# Patient Record
Sex: Male | Born: 1990 | Race: Black or African American | Hispanic: No | Marital: Single | Smoking: Never smoker
Health system: Southern US, Community
[De-identification: ages and names within clinical notes are randomized; demographics above are authoritative.]

## PROBLEM LIST (undated history)

## (undated) DIAGNOSIS — M6282 Rhabdomyolysis: Secondary | ICD-10-CM

---
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
## 2013-05-06 ENCOUNTER — Encounter (HOSPITAL_BASED_OUTPATIENT_CLINIC_OR_DEPARTMENT_OTHER): Payer: Self-pay | Admitting: *Deleted

## 2013-05-06 ENCOUNTER — Emergency Department (HOSPITAL_BASED_OUTPATIENT_CLINIC_OR_DEPARTMENT_OTHER)
Admission: EM | Admit: 2013-05-06 | Discharge: 2013-05-07 | Disposition: A | Discharge status: Home / Self Care | Payer: Medicaid Other | Attending: Emergency Medicine | Admitting: Emergency Medicine

## 2013-05-06 DIAGNOSIS — L738 Other specified follicular disorders: Secondary | ICD-10-CM | POA: Insufficient documentation

## 2013-05-06 DIAGNOSIS — L739 Follicular disorder, unspecified: Secondary | ICD-10-CM

## 2013-05-06 DIAGNOSIS — L678 Other hair color and hair shaft abnormalities: Secondary | ICD-10-CM | POA: Insufficient documentation

## 2013-05-06 NOTE — ED Notes (Signed)
Pt reports red bumps that started on his face this morning and now he has them on his arms.

## 2013-05-07 NOTE — ED Provider Notes (Signed)
CSN: [id number]     Arrival date & time 05/06/13  2345 History  This chart was scribed for Terry Krisher [name], MD by Ronal Fear, ED Scribe. This patient was seen in room MH11/MH11 and the patient's care was started at 12:01 AM.   Chief Complaint  Patient presents with  . Rash    Patient is a 22 y.o. Barry presenting with rash. The history is provided by the patient. No language interpreter was used.  Rash Location:  Full body Quality: itchiness   Severity:  Mild Onset quality:  Sudden Duration:  12 hours Timing:  Constant Progression:  Spreading Chronicity:  New Context comment:  Community shower Relieved by:  None tried Worsened by:  Nothing tried Ineffective treatments:  None tried Associated symptoms: no fever, no headaches, no joint pain, no periorbital edema and not wheezing     HPI Comments: Terry Barry who presents to the Emergency Department complaining of red, itchy bumps all over his body that appeared when he woke up this morning. They are on his face, arms and legs bilaterally. Pt states his tongue is itchy. He denies any other associated symptoms. Pt is in college and uses community showers.    History reviewed. No pertinent past medical history. History reviewed. No pertinent past surgical history. No family history on file. History  Substance Use Topics  . Smoking status: Never Smoker   . Smokeless tobacco: Not on file  . Alcohol Use: No    Review of Systems  Constitutional: Negative for fever.  Respiratory: Negative for wheezing.   Musculoskeletal: Negative for arthralgias.  Skin: Positive for rash.  Neurological: Negative for headaches.  All other systems reviewed and are negative.    Allergies  Review of patient's allergies indicates not on file.  Home Medications  No current outpatient prescriptions on file. BP 119/68  Pulse 61  Temp(Src) 98.5 F (36.9 C) (Oral)  Ht 5\' 7"  (1.702 m)  Wt 170 lb (77.111 kg)  BMI 26.62  kg/m2  SpO2 100% Physical Exam  Nursing note and vitals reviewed. Constitutional: He is oriented to person, place, and time. He appears well-developed and well-nourished. No distress.  HENT:  Head: Normocephalic and atraumatic.  No lesions in mouth.   Eyes: EOM are normal.  Neck: Neck supple. No tracheal deviation present.  Cardiovascular: Normal rate and normal heart sounds.   Pulmonary/Chest: Effort normal. No respiratory distress.  Abdominal: Soft. There is no tenderness.  Musculoskeletal: Normal range of motion.  Neurological: He is alert and oriented to person, place, and time.  Skin: Skin is warm and dry.  Papules to face. 4 papules to neck and 2 on bilateral arms with small white heads. Consistent with folliculitis. 1 insect bite to left clavicle. Nothing between fingers.   Psychiatric: He has a normal mood and affect. His behavior is normal.    ED Course  Procedures (including critical care time) DIAGNOSTIC STUDIES: Oxygen Saturation is 100% on RA, normal by my interpretation.    COORDINATION OF CARE: 12:05 AM- Pt advised of plan for treatment including using stridex pads for the face bumps and benadryl for itching and pt agrees. Advised him of keeping his cleaning products clean and dry to prevent future exposure and pt agrees.    Labs Review Labs Reviewed - No data to display Imaging Review No results found.  MDM      I personally performed the services described in this documentation, which was scribed in my presence.  The recorded information has been reviewed and is accurate.     Terry Awe, MD 05/07/13 [phone]

## 2013-07-17 ENCOUNTER — Encounter (HOSPITAL_BASED_OUTPATIENT_CLINIC_OR_DEPARTMENT_OTHER): Payer: Self-pay | Admitting: Emergency Medicine

## 2013-07-17 ENCOUNTER — Emergency Department (HOSPITAL_BASED_OUTPATIENT_CLINIC_OR_DEPARTMENT_OTHER): Payer: Medicaid Other

## 2013-07-17 ENCOUNTER — Emergency Department (HOSPITAL_BASED_OUTPATIENT_CLINIC_OR_DEPARTMENT_OTHER)
Admission: EM | Admit: 2013-07-17 | Discharge: 2013-07-17 | Disposition: A | Discharge status: Home / Self Care | Payer: Medicaid Other | Attending: Emergency Medicine | Admitting: Emergency Medicine

## 2013-07-17 DIAGNOSIS — R0789 Other chest pain: Secondary | ICD-10-CM | POA: Insufficient documentation

## 2013-07-17 DIAGNOSIS — Z8739 Personal history of other diseases of the musculoskeletal system and connective tissue: Secondary | ICD-10-CM | POA: Insufficient documentation

## 2013-07-17 DIAGNOSIS — R071 Chest pain on breathing: Secondary | ICD-10-CM

## 2013-07-17 DIAGNOSIS — R05 Cough: Secondary | ICD-10-CM | POA: Insufficient documentation

## 2013-07-17 DIAGNOSIS — R059 Cough, unspecified: Secondary | ICD-10-CM | POA: Insufficient documentation

## 2013-07-17 DIAGNOSIS — R0602 Shortness of breath: Secondary | ICD-10-CM | POA: Insufficient documentation

## 2013-07-17 HISTORY — DX: Rhabdomyolysis: M62.82

## 2013-07-17 LAB — CK: Total CK: 584 U/L — ABNORMAL HIGH (ref 7–232)

## 2013-07-17 LAB — COMPREHENSIVE METABOLIC PANEL
ALT: 12 U/L (ref 0–53)
AST: 28 U/L (ref 0–37)
Albumin: 4.3 g/dL (ref 3.5–5.2)
Alkaline Phosphatase: 81 U/L (ref 39–117)
BUN: 9 mg/dL (ref 6–23)
CO2: 28 mEq/L (ref 19–32)
Calcium: 9.3 mg/dL (ref 8.4–10.5)
Chloride: 102 mEq/L (ref 96–112)
Creatinine, Ser: 1 mg/dL (ref 0.50–1.35)
GFR calc Af Amer: >90 mL/min (ref >90)
GFR calc non Af Amer: >90 mL/min (ref >90)
Glucose, Bld: 99 mg/dL (ref 70–99)
Potassium: 3.5 mEq/L (ref 3.5–5.1)
Sodium: 140 mEq/L (ref 135–145)
Total Bilirubin: 0.3 mg/dL (ref 0.3–1.2)
Total Protein: 8.1 g/dL (ref 6.0–8.3)

## 2013-07-17 LAB — CBC WITH DIFFERENTIAL/PLATELET
Basophils Absolute: 0 10*3/uL (ref 0.0–0.1)
Basophils Relative: 0 % (ref 0–1)
Eosinophils Absolute: 0.3 10*3/uL (ref 0.0–0.7)
Eosinophils Relative: 4 % (ref 0–5)
HCT: 38.1 % — ABNORMAL LOW (ref 39.0–52.0)
Hemoglobin: 12.8 g/dL — ABNORMAL LOW (ref 13.0–17.0)
Lymphocytes Relative: 30 % (ref 12–46)
Lymphs Abs: 2.6 10*3/uL (ref 0.7–4.0)
MCH: 29.2 pg (ref 26.0–34.0)
MCHC: 33.6 g/dL (ref 30.0–36.0)
MCV: 86.8 fL (ref 78.0–100.0)
Monocytes Absolute: 0.7 10*3/uL (ref 0.1–1.0)
Monocytes Relative: 8 % (ref 3–12)
Neutro Abs: 5.1 10*3/uL (ref 1.7–7.7)
Neutrophils Relative %: 58 % (ref 43–77)
Platelets: 243 10*3/uL (ref 150–400)
RBC: 4.39 MIL/uL (ref 4.22–5.81)
RDW: 11.8 % (ref 11.5–15.5)
WBC: 8.8 10*3/uL (ref 4.0–10.5)

## 2013-07-17 LAB — D-DIMER, QUANTITATIVE (NOT AT ARMC): D-Dimer, Quant: <0.27 ug/mL-FEU (ref 0.00–0.48)

## 2013-07-17 LAB — URINALYSIS, ROUTINE W REFLEX MICROSCOPIC
Bilirubin Urine: NEGATIVE
Glucose, UA: NEGATIVE mg/dL
Hgb urine dipstick: NEGATIVE
Ketones, ur: NEGATIVE mg/dL
Leukocytes, UA: NEGATIVE
Nitrite: NEGATIVE
Protein, ur: NEGATIVE mg/dL
Specific Gravity, Urine: 1.019 (ref 1.005–1.030)
Urobilinogen, UA: 0.2 mg/dL (ref 0.0–1.0)
pH: 6.5 (ref 5.0–8.0)

## 2013-07-17 LAB — TROPONIN I: Troponin I: <0.3 ng/mL (ref <0.30)

## 2013-07-17 MED ORDER — IBUPROFEN 800 MG PO TABS: 800.0000 mg | ORAL_TABLET | Freq: Three times a day (TID) | ORAL | Status: DC | PRN

## 2013-07-17 MED ORDER — HYDROCODONE-ACETAMINOPHEN 5-325 MG PO TABS
1.0000 | ORAL_TABLET | Freq: Once | ORAL | Status: AC
  Administered 2013-07-17: 1 via ORAL
  Filled 2013-07-17: qty 1

## 2013-07-17 MED ORDER — HYDROCODONE-ACETAMINOPHEN 5-325 MG PO TABS: 1.0000 | ORAL_TABLET | Freq: Four times a day (QID) | ORAL | Status: AC | PRN

## 2013-07-17 NOTE — ED Provider Notes (Signed)
CSN: [id number]     Arrival date & time 07/17/13  4098 History   None    Chief Complaint  Patient presents with  . Shortness of Breath  . Chest Pain   (Consider location/radiation/quality/duration/timing/severity/associated sxs/prior Treatment) Patient is a 22 y.o. male presenting with chest pain. The history is provided by the patient.  Chest Pain Pain location:  L chest Pain quality: stabbing, throbbing and tightness   Pain quality: not aching, not burning, not crushing, not shooting and not tearing   Pain radiates to:  L shoulder Pain radiates to the back: no   Pain severity:  Severe Onset quality:  Sudden Duration:  12 hours Timing:  Constant Progression:  Unchanged Chronicity:  New Context: breathing, movement, raising an arm and stress   Context: no drug use, not eating, no intercourse, not lifting, not at rest and no trauma   Relieved by:  Nothing Worsened by:  Coughing, exertion, movement and deep breathing Ineffective treatments:  None tried Associated symptoms: no abdominal pain, no altered mental status, no anorexia, no anxiety, no back pain, no cough, no dizziness, no dysphagia, no fatigue, no fever, no headache, no heartburn, no lower extremity edema, no nausea, no near-syncope, no numbness, no orthopnea, no palpitations, no PND, no shortness of breath, no syncope, not vomiting and no weakness   Risk factors: male sex   Risk factors: no aortic disease, no birth control, no coronary artery disease, no diabetes mellitus, no hypertension, no immobilization, no Marfan's syndrome, not obese, not pregnant, no prior DVT/PE, no smoking and no surgery    Patient reports a lot of stress at college. He is trying to finish a lot of work for the end of the semester.  Of note, PMHX significant for rhabdomyolysis with a negative genetic w/u. Patient reports no recent physical activity.  Past Medical History  Diagnosis Date  . Rhabdomyolysis    History reviewed. No pertinent past  surgical history. History reviewed. No pertinent family history. History  Substance Use Topics  . Smoking status: Never Smoker   . Smokeless tobacco: Not on file  . Alcohol Use: No    Review of Systems  Constitutional: Negative for fever and fatigue.  HENT: Negative for trouble swallowing.   Respiratory: Negative for cough and shortness of breath.   Cardiovascular: Positive for chest pain. Negative for palpitations, orthopnea, syncope, PND and near-syncope.  Gastrointestinal: Negative for heartburn, nausea, vomiting, abdominal pain and anorexia.  Musculoskeletal: Negative for back pain.  Neurological: Negative for dizziness, weakness, numbness and headaches.    Allergies  Review of patient's allergies indicates no known allergies.  Home Medications  No current outpatient prescriptions on file. There were no vitals taken for this visit. Physical Exam  Constitutional: He is oriented to person, place, and time. He appears well-developed and well-nourished.  HENT:  Head: Normocephalic.  Mouth/Throat: Oropharynx is clear and moist. No oropharyngeal exudate.  Eyes: Conjunctivae and EOM are normal. Pupils are equal, round, and reactive to light.  Neck: Normal range of motion. Neck supple.  Cardiovascular: Normal rate, normal heart sounds and intact distal pulses.  Exam reveals no gallop and no friction rub.   No murmur heard. Pulmonary/Chest: Effort normal and breath sounds normal. No respiratory distress. He has no wheezes. He has no rales. He exhibits tenderness.    Tender to palaption  Abdominal: Soft. Bowel sounds are normal. He exhibits no distension and no mass. There is no tenderness. There is no rebound and no guarding.  Musculoskeletal: He  exhibits tenderness. He exhibits no edema.  Neurological: He is alert and oriented to person, place, and time. No cranial nerve deficit.  Skin: Skin is warm and dry. He is not diaphoretic.  Psychiatric: He has a normal mood and affect.  His behavior is normal.    ED Course  Procedures (including critical care time) Labs Review Labs Reviewed  CK - Abnormal; Notable for the following:    Total CK 584 (*)    All other components within normal limits  CBC WITH DIFFERENTIAL - Abnormal; Notable for the following:    Hemoglobin 12.8 (*)    HCT 38.1 (*)    All other components within normal limits  D-DIMER, QUANTITATIVE  COMPREHENSIVE METABOLIC PANEL  URINALYSIS, ROUTINE W REFLEX MICROSCOPIC  TROPONIN I   Imaging Review Dg Chest 2 View  07/17/2013   CLINICAL DATA:  Left-sided chest pain beginning 1 day ago.  EXAM: CHEST  2 VIEW  COMPARISON:  None.  FINDINGS: Heart size and mediastinal contours are within normal limits. Both lungs are clear. Visualized skeletal structures are unremarkable.  IMPRESSION: Negative exam.   Electronically Signed   By: Drusilla Kanner M.D.   On: 07/17/2013 07:47    EKG Interpretation     Ventricular Rate:  59 PR Interval:  150 QRS Duration: 88 QT Interval:  428 QTC Calculation: 423 R Axis:   59 Text Interpretation:  Sinus bradycardia Possible Left atrial enlargement Left ventricular hypertrophy Abnormal ECG Early repolarization pattern No significant change since last tracing            MDM   1. Chest pain on breathing     1. MSK The patients CP appears to MSK in origin. Troponin negative, CXR normal (no pneumothorax or PNA). Due to his h/o rhambdomyolysis, we checked CK which was mildly elevated, but not in the rhabdomyolysis range at this point. Cr was normal as well. I recommended that the patient f/u later this week with PCP to recheck CK. Also recommended patient to not perform and physical activity for 1 week. I also gave a prescription for 10 Vicodin for pain as needed and 800 mg of ibuprofen every 8 hours until the symptoms abate. The patient was instructed to return to ED for new or worsening symptoms.  The patient agreed with this plan.    Pleas Koch,  MD 07/17/13 1003

## 2013-07-17 NOTE — ED Notes (Signed)
Pt presents with left sided chest pain that began last night - states he is also short of breath.

## 2013-07-17 NOTE — ED Provider Notes (Signed)
I saw and evaluated the patient, reviewed the resident's note and I agree with the findings and plan.  EKG Interpretation     Ventricular Rate:  59 PR Interval:  150 QRS Duration: 88 QT Interval:  428 QTC Calculation: 423 R Axis:   59 Text Interpretation:  Sinus bradycardia Possible Left atrial enlargement Left ventricular hypertrophy Abnormal ECG Early repolarization pattern No significant change since last tracing           Results for orders placed during the hospital encounter of 07/17/13  CK      Result Value Range   Total CK 584 (*) 7 - 232 U/L  D-DIMER, QUANTITATIVE      Result Value Range   D-Dimer, Quant <0.27  0.00 - 0.48 ug/mL-FEU  CBC WITH DIFFERENTIAL      Result Value Range   WBC 8.8  4.0 - 10.5 K/uL   RBC 4.39  4.22 - 5.81 MIL/uL   Hemoglobin 12.8 (*) 13.0 - 17.0 g/dL   HCT 36.6 (*) 44.0 - 34.7 %   MCV 86.8  78.0 - 100.0 fL   MCH 29.2  26.0 - 34.0 pg   MCHC 33.6  30.0 - 36.0 g/dL   RDW 42.5  95.6 - 38.7 %   Platelets 243  150 - 400 K/uL   Neutrophils Relative % 58  43 - 77 %   Neutro Abs 5.1  1.7 - 7.7 K/uL   Lymphocytes Relative 30  12 - 46 %   Lymphs Abs 2.6  0.7 - 4.0 K/uL   Monocytes Relative 8  3 - 12 %   Monocytes Absolute 0.7  0.1 - 1.0 K/uL   Eosinophils Relative 4  0 - 5 %   Eosinophils Absolute 0.3  0.0 - 0.7 K/uL   Basophils Relative 0  0 - 1 %   Basophils Absolute 0.0  0.0 - 0.1 K/uL  COMPREHENSIVE METABOLIC PANEL      Result Value Range   Sodium 140  135 - 145 mEq/L   Potassium 3.5  3.5 - 5.1 mEq/L   Chloride 102  96 - 112 mEq/L   CO2 28  19 - 32 mEq/L   Glucose, Bld 99  70 - 99 mg/dL   BUN 9  6 - 23 mg/dL   Creatinine, Ser 5.64  0.50 - 1.35 mg/dL   Calcium 9.3  8.4 - 33.2 mg/dL   Total Protein 8.1  6.0 - 8.3 g/dL   Albumin 4.3  3.5 - 5.2 g/dL   AST 28  0 - 37 U/L   ALT 12  0 - 53 U/L   Alkaline Phosphatase 81  39 - 117 U/L   Total Bilirubin 0.3  0.3 - 1.2 mg/dL   GFR calc non Af Amer >90  >90 mL/min   GFR calc Af Amer >90   >90 mL/min  URINALYSIS, ROUTINE W REFLEX MICROSCOPIC      Result Value Range   Color, Urine YELLOW  YELLOW   APPearance CLEAR  CLEAR   Specific Gravity, Urine 1.019  1.005 - 1.030   pH 6.5  5.0 - 8.0   Glucose, UA NEGATIVE  NEGATIVE mg/dL   Hgb urine dipstick NEGATIVE  NEGATIVE   Bilirubin Urine NEGATIVE  NEGATIVE   Ketones, ur NEGATIVE  NEGATIVE mg/dL   Protein, ur NEGATIVE  NEGATIVE mg/dL   Urobilinogen, UA 0.2  0.0 - 1.0 mg/dL   Nitrite NEGATIVE  NEGATIVE   Leukocytes, UA NEGATIVE  NEGATIVE  TROPONIN  I      Result Value Range   Troponin I <0.30  <0.30 ng/mL   Results for orders placed during the hospital encounter of 07/17/13  CK      Result Value Range   Total CK 584 (*) 7 - 232 U/L  D-DIMER, QUANTITATIVE      Result Value Range   D-Dimer, Quant <0.27  0.00 - 0.48 ug/mL-FEU  CBC WITH DIFFERENTIAL      Result Value Range   WBC 8.8  4.0 - 10.5 K/uL   RBC 4.39  4.22 - 5.81 MIL/uL   Hemoglobin 12.8 (*) 13.0 - 17.0 g/dL   HCT 04.5 (*) 40.9 - 81.1 %   MCV 86.8  78.0 - 100.0 fL   MCH 29.2  26.0 - 34.0 pg   MCHC 33.6  30.0 - 36.0 g/dL   RDW 91.4  78.2 - 95.6 %   Platelets 243  150 - 400 K/uL   Neutrophils Relative % 58  43 - 77 %   Neutro Abs 5.1  1.7 - 7.7 K/uL   Lymphocytes Relative 30  12 - 46 %   Lymphs Abs 2.6  0.7 - 4.0 K/uL   Monocytes Relative 8  3 - 12 %   Monocytes Absolute 0.7  0.1 - 1.0 K/uL   Eosinophils Relative 4  0 - 5 %   Eosinophils Absolute 0.3  0.0 - 0.7 K/uL   Basophils Relative 0  0 - 1 %   Basophils Absolute 0.0  0.0 - 0.1 K/uL  COMPREHENSIVE METABOLIC PANEL      Result Value Range   Sodium 140  135 - 145 mEq/L   Potassium 3.5  3.5 - 5.1 mEq/L   Chloride 102  96 - 112 mEq/L   CO2 28  19 - 32 mEq/L   Glucose, Bld 99  70 - 99 mg/dL   BUN 9  6 - 23 mg/dL   Creatinine, Ser 2.13  0.50 - 1.35 mg/dL   Calcium 9.3  8.4 - 08.6 mg/dL   Total Protein 8.1  6.0 - 8.3 g/dL   Albumin 4.3  3.5 - 5.2 g/dL   AST 28  0 - 37 U/L   ALT 12  0 - 53 U/L    Alkaline Phosphatase 81  39 - 117 U/L   Total Bilirubin 0.3  0.3 - 1.2 mg/dL   GFR calc non Af Amer >90  >90 mL/min   GFR calc Af Amer >90  >90 mL/min  URINALYSIS, ROUTINE W REFLEX MICROSCOPIC      Result Value Range   Color, Urine YELLOW  YELLOW   APPearance CLEAR  CLEAR   Specific Gravity, Urine 1.019  1.005 - 1.030   pH 6.5  5.0 - 8.0   Glucose, UA NEGATIVE  NEGATIVE mg/dL   Hgb urine dipstick NEGATIVE  NEGATIVE   Bilirubin Urine NEGATIVE  NEGATIVE   Ketones, ur NEGATIVE  NEGATIVE mg/dL   Protein, ur NEGATIVE  NEGATIVE mg/dL   Urobilinogen, UA 0.2  0.0 - 1.0 mg/dL   Nitrite NEGATIVE  NEGATIVE   Leukocytes, UA NEGATIVE  NEGATIVE  TROPONIN I      Result Value Range   Troponin I <0.30  <0.30 ng/mL   Dg Chest 2 View  07/17/2013   CLINICAL DATA:  Left-sided chest pain beginning 1 day ago.  EXAM: CHEST  2 VIEW  COMPARISON:  None.  FINDINGS: Heart size and mediastinal contours are within normal limits. Both lungs are clear.  Visualized skeletal structures are unremarkable.  IMPRESSION: Negative exam.   Electronically Signed   By: Drusilla Kanner M.D.   On: 07/17/2013 07:47    The patient seen by me. Extensive workup how to make sure there was no evidence of pneumonia pneumothorax pulmonary embolism rhabdomyolysis. Patient does have a mild elevation in the CPK but not consistent with rhabdo. Patient shortly has had that happen in the past. He is currently not extensively active with sports. Pain is mostly in the left rhomboid muscle area we'll treat with pain medicine and anti-inflammatories. Patient given precautions if the urine gets darker. Renal function is normal. Think the pain is mostly musculoskeletal in nature.  Shelda Jakes, MD 07/17/13 [phone]

## 2015-06-13 IMAGING — CR DG CHEST 2V
2 series · 2 of 2 positions shown · non-contrast
Comparison: None.

CLINICAL DATA: Left-sided chest pain beginning 1 day ago.

EXAM:
CHEST  2 VIEW

[w chest pa]
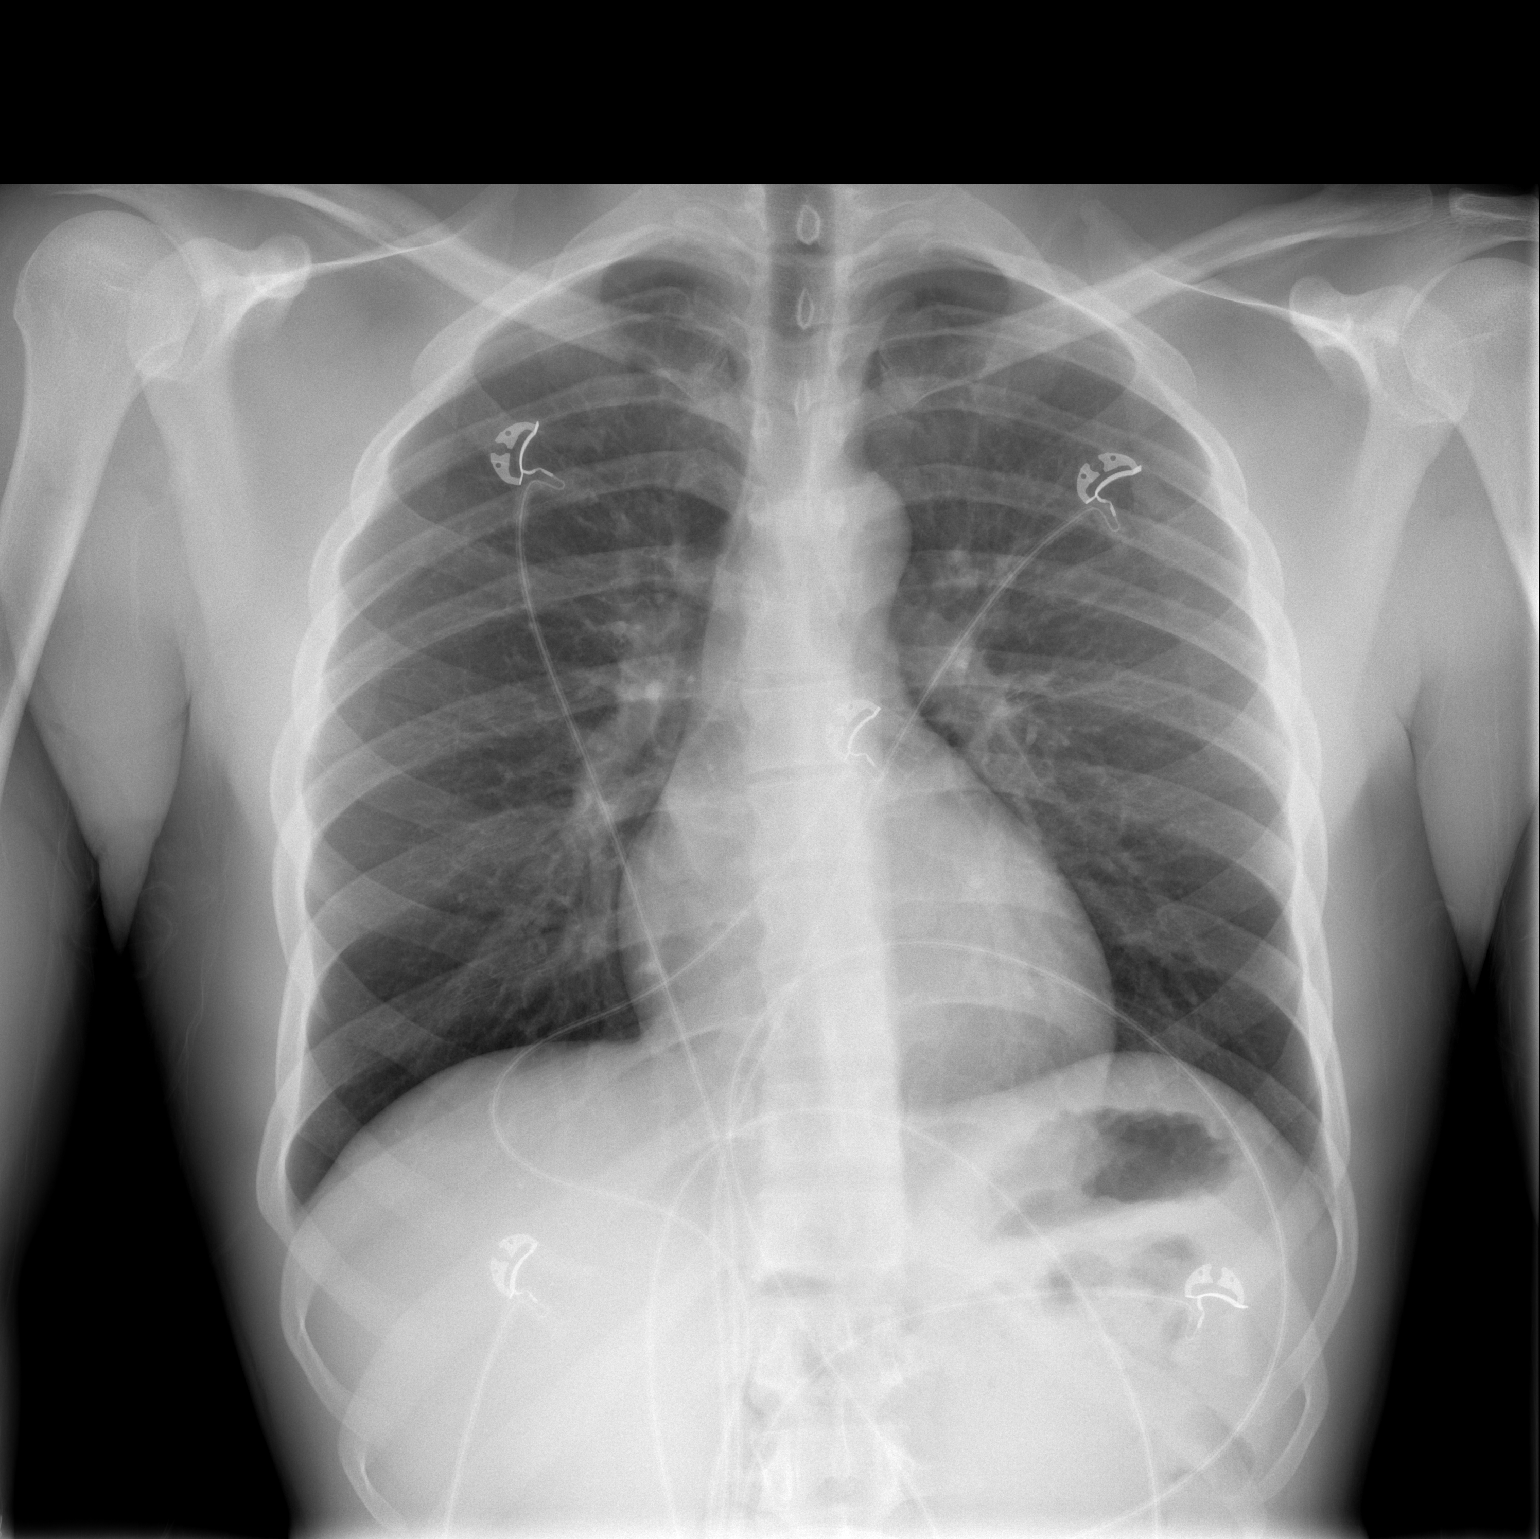

[w chest lat]
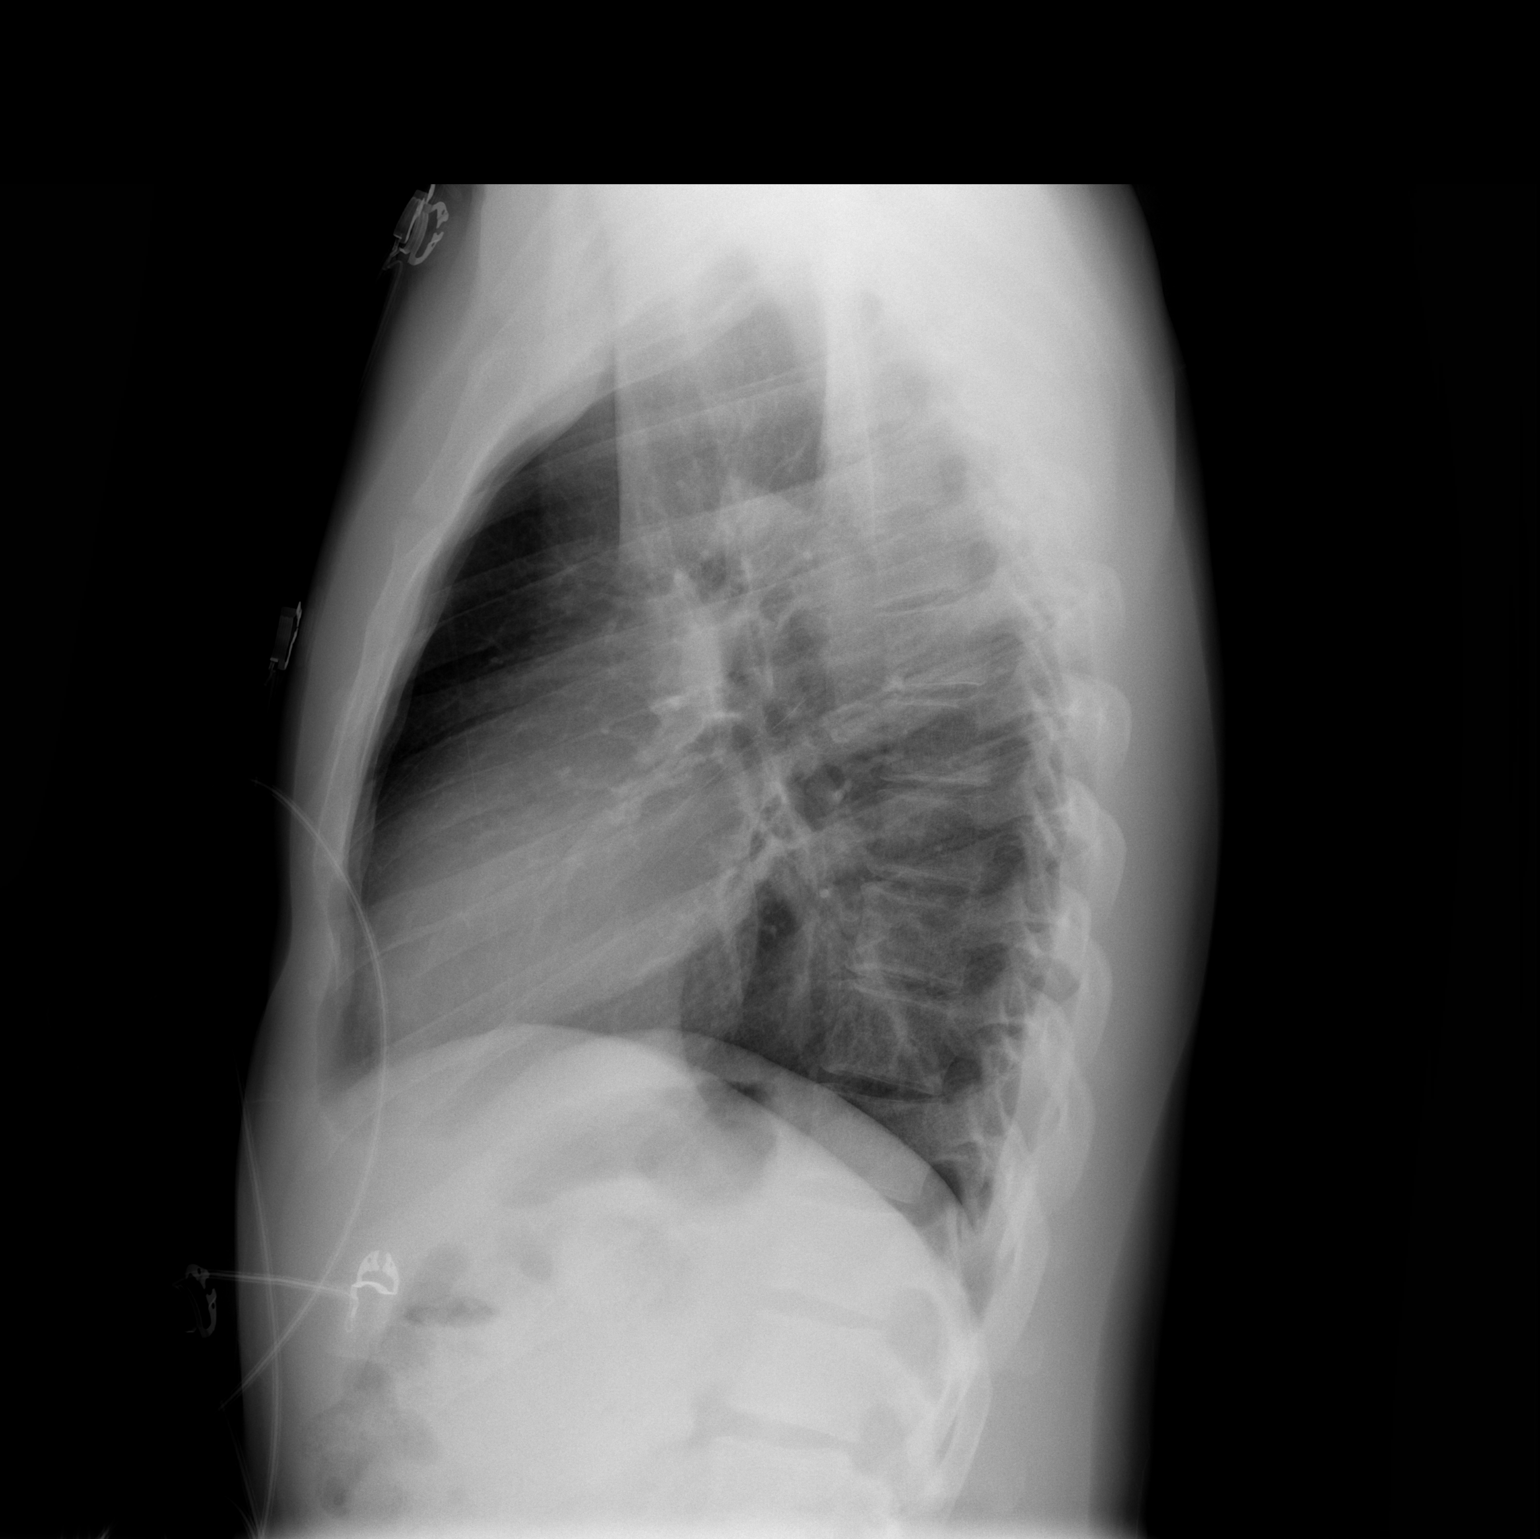

[2 of 2 positions shown; findings below may reference images not displayed]

FINDINGS: Heart size and mediastinal contours are within normal limits. Both
lungs are clear. Visualized skeletal structures are unremarkable.
IMPRESSION: Negative exam.

## 2015-09-11 ENCOUNTER — Encounter (HOSPITAL_BASED_OUTPATIENT_CLINIC_OR_DEPARTMENT_OTHER): Payer: Self-pay | Admitting: *Deleted

## 2015-09-11 ENCOUNTER — Emergency Department (HOSPITAL_BASED_OUTPATIENT_CLINIC_OR_DEPARTMENT_OTHER)
Admission: EM | Admit: 2015-09-11 | Discharge: 2015-09-11 | Disposition: A | Discharge status: Home / Self Care | Payer: BC Managed Care – PPO | Attending: Emergency Medicine | Admitting: Emergency Medicine

## 2015-09-11 DIAGNOSIS — J029 Acute pharyngitis, unspecified: Secondary | ICD-10-CM | POA: Diagnosis not present

## 2015-09-11 DIAGNOSIS — B349 Viral infection, unspecified: Secondary | ICD-10-CM | POA: Diagnosis not present

## 2015-09-11 DIAGNOSIS — Z8739 Personal history of other diseases of the musculoskeletal system and connective tissue: Secondary | ICD-10-CM | POA: Insufficient documentation

## 2015-09-11 LAB — RAPID STREP SCREEN (MED CTR MEBANE ONLY): Streptococcus, Group A Screen (Direct): NEGATIVE

## 2015-09-11 MED ORDER — NAPROXEN 500 MG PO TABS: 500.0000 mg | ORAL_TABLET | Freq: Two times a day (BID) | ORAL | Status: AC

## 2015-09-11 NOTE — Discharge Instructions (Signed)
Rapid strep test was negative. As we discussed we have not ruled out infectious mononucleosis. If symptoms persist this testing can be done later. Take the Naprosyn as directed. Return for any new or worse symptoms. School work note provided.

## 2015-09-11 NOTE — ED Provider Notes (Signed)
CSN: [id number]     Arrival date & time 09/11/15  1027 History   First MD Initiated Contact with Patient 09/11/15 1105     Chief Complaint  Patient presents with  . Sore Throat     (Consider location/radiation/quality/duration/timing/severity/associated sxs/prior Treatment) Patient is a 25 y.o. male presenting with pharyngitis. The history is provided by the patient.  Sore Throat Pertinent negatives include no chest pain, no headaches and no shortness of breath.   patient with 3 day history of sore throat feels like his glands under his jaw and neck are enlarged. Pain with swallowing bilateral eye pain is not severe. No neck stiffness. Associated with fever chills and some sweats. No cough no shortness of breath patient denies any sinus congestion to me. Patient with a history of rhabdomyolysis. Talks about urine not being dark currently and no muscle aches.  Past Medical History  Diagnosis Date  . Rhabdomyolysis    History reviewed. No pertinent past surgical history. History reviewed. No pertinent family history. Social History  Substance Use Topics  . Smoking status: Never Smoker   . Smokeless tobacco: None  . Alcohol Use: No    Review of Systems  Constitutional: Positive for fever, chills and diaphoresis.  HENT: Positive for sore throat. Negative for congestion.   Eyes: Positive for pain. Negative for photophobia, redness and visual disturbance.  Respiratory: Negative for shortness of breath.   Cardiovascular: Negative for chest pain.  Genitourinary: Negative for dysuria and hematuria.  Musculoskeletal: Negative for myalgias, back pain, neck pain and neck stiffness.  Skin: Negative for rash.  Neurological: Negative for headaches.  Hematological: Does not bruise/bleed easily.  Psychiatric/Behavioral: Negative for confusion.      Allergies  Review of patient's allergies indicates no known allergies.  Home Medications   Prior to Admission medications   Medication  Sig Start Date End Date Taking? Authorizing Provider  HYDROcodone-acetaminophen (NORCO/VICODIN) 5-325 MG per tablet Take 1 tablet by mouth every 6 (six) hours as needed for moderate pain. 07/17/13   Bobbye Charleston, MD  ibuprofen (ADVIL,MOTRIN) 800 MG tablet Take 1 tablet (800 mg total) by mouth every 8 (eight) hours as needed. 07/17/13   Bobbye Charleston, MD  naproxen (NAPROSYN) 500 MG tablet Take 1 tablet (500 mg total) by mouth 2 (two) times daily. 09/11/15   Vanetta Mulders, MD   BP 121/75 mmHg  Pulse 68  Temp(Src) 98.6 F (37 C) (Oral)  Resp 18  Ht 5\' 7"  (1.702 m)  Wt 74.844 kg  BMI 25.84 kg/m2  SpO2 100% Physical Exam  Constitutional: He is oriented to person, place, and time. He appears well-developed and well-nourished. No distress.  HENT:  Head: Normocephalic and atraumatic.  Mouth/Throat: Oropharynx is clear and moist. No oropharyngeal exudate.  Tonsils enlarged bilaterally erythematous no exudate. Coating on the tongue no oral lesions noted.  Neck: Normal range of motion. Neck supple.  No palpable cervical adenopathy.  Cardiovascular: Normal rate, regular rhythm and normal heart sounds.   No murmur heard. Pulmonary/Chest: Effort normal and breath sounds normal. No respiratory distress. He has no wheezes. He has no rales.  Abdominal: Soft. Bowel sounds are normal. There is no tenderness.  Musculoskeletal: Normal range of motion. He exhibits no edema.  Lymphadenopathy:    He has no cervical adenopathy.  Neurological: He is alert and oriented to person, place, and time. No cranial nerve deficit. He exhibits normal muscle tone. Coordination normal.  Skin: Skin is warm. No rash noted.  Nursing note and  vitals reviewed.   ED Course  Procedures (including critical care time) Labs Review Labs Reviewed  RAPID STRE[facility] SCREEN (NOT AT [facility])    Imaging Review No results found. I have personally reviewed and evaluated these images and lab results as part of my  medical decision-making.   EKG Interpretation None      MDM   Final diagnoses:  Pharyngitis  Viral illness    Patient nontoxic no acute distress. Rapid strep is negative. Symptoms are predominantly sore throat and feeling as if his glans or swelling no palpable adenopathy. Coating on the tongue and some soreness of the mouth. But no visible lesions. Symptoms could be related to mononucleosis. Patient's only been symptomatic for 3 days. We'll not test at this point in time. Patient with a history of rhabdo mild lysis patient is not giving any of history of any dark urine currently no real body aches. Patient's complaints also could be related to an upper respiratory infection. Asians main complaint is the sore throats had some fever and chills and some sweats with it. Afebrile here. Some bilateral eye discomfort but no severe pain. No abdominal pain no nausea vomiting or diarrhea. No rash.  Vanetta MuldersScott Geraldine Sandberg, MD 09/11/15 1228

## 2015-09-11 NOTE — ED Notes (Signed)
Pt amb to triage with quick steady gait in nad. Pt reports facial pain and sinus congestion with sore throat x Sunday.

## 2015-09-13 LAB — CULTURE, GROUP A STREP (THRC)

## 2020-09-04 ENCOUNTER — Emergency Department (HOSPITAL_BASED_OUTPATIENT_CLINIC_OR_DEPARTMENT_OTHER)
Admission: EM | Admit: 2020-09-04 | Discharge: 2020-09-04 | Disposition: A | Discharge status: Home / Self Care | Payer: BC Managed Care – PPO | Attending: Emergency Medicine | Admitting: Emergency Medicine

## 2020-09-04 ENCOUNTER — Encounter (HOSPITAL_BASED_OUTPATIENT_CLINIC_OR_DEPARTMENT_OTHER): Payer: Self-pay | Admitting: Emergency Medicine

## 2020-09-04 ENCOUNTER — Other Ambulatory Visit: Payer: Self-pay

## 2020-09-04 DIAGNOSIS — Z20822 Contact with and (suspected) exposure to covid-19: Secondary | ICD-10-CM

## 2020-09-04 DIAGNOSIS — R509 Fever, unspecified: Secondary | ICD-10-CM | POA: Diagnosis present

## 2020-09-04 DIAGNOSIS — U071 COVID-19: Secondary | ICD-10-CM | POA: Diagnosis not present

## 2020-09-04 LAB — SARS CORONAVIRUS 2 (TAT 6-24 HRS): SARS Coronavirus 2: POSITIVE — AB

## 2020-09-04 MED ORDER — IBUPROFEN 800 MG PO TABS: 800.0000 mg | ORAL_TABLET | Freq: Three times a day (TID) | ORAL | 0 refills | Status: AC | PRN

## 2020-09-04 MED ORDER — KETOROLAC TROMETHAMINE 30 MG/ML IJ SOLN
30.0000 mg | Freq: Once | INTRAMUSCULAR | Status: AC
  Administered 2020-09-04: 30 mg via INTRAMUSCULAR
  Filled 2020-09-04: qty 1

## 2020-09-04 NOTE — ED Notes (Signed)
Pt here with complaints of covid symptoms since yesterday.

## 2020-09-04 NOTE — Discharge Instructions (Signed)
You were seen in the emerge department today with COVID-19-like symptoms.  I am providing several medications to help with your symptoms.  You can check the test results in the MyChart app in the next 6 to 24 hours.  Return to the emergency department any new or suddenly worsening symptoms.  Please quarantine for the next 5 days followed by 5 days of strict masking.

## 2020-09-04 NOTE — ED Triage Notes (Signed)
Pt c/o headache, leg pain, fever, cough. Pt and sister with same symptoms.

## 2020-09-04 NOTE — ED Provider Notes (Signed)
Emergency Department Provider Note   I have reviewed the triage vital signs and the nursing notes.   HISTORY  Chief Complaint Covid Symptoms   HPI Terry Barry is a 30 y.o. male with past medical history reviewed below presents to the emergency department with fever, body aches, cough, headache.  Notes a family members have a similar symptoms at home and concern for possible Covid 19 exposure.  Patient denies any chest pain or shortness of breath.  No history of breathing issues or asthma.  Denies any radiation of symptoms or other modifying factors.  He has been trying over-the-counter medications with some mild relief in symptoms.  Patient does note that he is having a breakthrough migraine headache.  The headache is bitemporal and feels similar to his prior migraines.   Past Medical History:  Diagnosis Date  . Rhabdomyolysis     There are no problems to display for this patient.   History reviewed. No pertinent surgical history.  Allergies Patient has no known allergies.  No family history on file.  Social History Social History   Tobacco Use  . Smoking status: Never Smoker  Substance Use Topics  . Alcohol use: No  . Drug use: No    Review of Systems  Constitutional: Positive fever, chills, body aches. Eyes: No visual changes. ENT: No sore throat. Cardiovascular: Denies chest pain. Respiratory: Denies shortness of breath. Positive cough.  Gastrointestinal: No abdominal pain.  No nausea, no vomiting.  No diarrhea.  No constipation. Genitourinary: Negative for dysuria. Musculoskeletal: Positive body aches.  Skin: Negative for rash. Neurological: Negative for focal weakness or numbness. Positive HA.   10-point ROS otherwise negative.  ____________________________________________   PHYSICAL EXAM:  VITAL SIGNS: ED Triage Vitals  Enc Vitals Group     BP 09/04/20 0621 122/79     Pulse Rate 09/04/20 0621 80     Resp 09/04/20 0621 16     Temp  09/04/20 0621 98.8 F (37.1 C)     Temp Source 09/04/20 0621 Oral     SpO2 09/04/20 0621 98 %     Weight 09/04/20 0619 170 lb (77.1 kg)     Height 09/04/20 0619 5\' 7"  (1.702 m)   Constitutional: Alert and oriented. Well appearing and in no acute distress. Eyes: Conjunctivae are normal. Head: Atraumatic. Nose: No congestion/rhinnorhea. Mouth/Throat: Mucous membranes are moist.   Neck: No stridor.  Cardiovascular: Normal rate, regular rhythm. Good peripheral circulation. Grossly normal heart sounds.   Respiratory: Normal respiratory effort.  No retractions. Lungs CTAB. Gastrointestinal: Soft and nontender. No distention.  Musculoskeletal: No lower extremity tenderness nor edema. No gross deformities of extremities. Neurologic:  Normal speech and language. No gross focal neurologic deficits are appreciated.  Skin:  Skin is warm, dry and intact. No rash noted.   ____________________________________________   LABS (all labs ordered are listed, but only abnormal results are displayed)  Labs Reviewed  SARS CORONAVIRUS 2 (TAT 6-24 HRS)   ____________________________________________   PROCEDURES  Procedure(s) performed:   Procedures  None  ____________________________________________   INITIAL IMPRESSION / ASSESSMENT AND PLAN / ED COURSE  Pertinent labs & imaging results that were available during my care of the patient were reviewed by me and considered in my medical decision making (see chart for details).   Patient presents emergency department with COVID-19-like symptoms.  No hypoxemia.  No increased work of breathing.  Patient is having a migraine headache typical of his migraines.  Plan for Toradol.  Plan for Covid  PCR testing here.  Results will come back in 6 to 24 hours.  Patient advised to quarantine per CDC recommendation.  Work note provided.  Discussed supportive care at home along with ED return precautions.  Terry Barry was evaluated in Emergency Department  on 09/04/2020 for the symptoms described in the history of present illness. He was evaluated in the context of the global COVID-19 pandemic, which necessitated consideration that the patient might be at risk for infection with the SARS-CoV-2 virus that causes COVID-19. Institutional protocols and algorithms that pertain to the evaluation of patients at risk for COVID-19 are in a state of rapid change based on information released by regulatory bodies including the CDC and federal and state organizations. These policies and algorithms were followed during the patient's care in the ED. ____________________________________________  FINAL CLINICAL IMPRESSION(S) / ED DIAGNOSES  Final diagnoses:  Suspected COVID-19 virus infection     MEDICATIONS GIVEN DURING THIS VISIT:  Medications  ketorolac (TORADOL) 30 MG/ML injection 30 mg (has no administration in time range)     NEW OUTPATIENT MEDICATIONS STARTED DURING THIS VISIT:  New Prescriptions   IBUPROFEN (ADVIL) 800 MG TABLET    Take 1 tablet (800 mg total) by mouth every 8 (eight) hours as needed for fever or moderate pain.    Note:  This document was prepared using Dragon voice recognition software and may include unintentional dictation errors.  Alona Bene, MD, [facility] Emergency Medicine    Makalah Asberry, Arlyss Repress, MD 09/04/20 [phone]

## 2021-08-12 ENCOUNTER — Encounter (HOSPITAL_BASED_OUTPATIENT_CLINIC_OR_DEPARTMENT_OTHER): Payer: Self-pay

## 2021-08-12 ENCOUNTER — Other Ambulatory Visit: Payer: Self-pay

## 2021-08-12 ENCOUNTER — Emergency Department (HOSPITAL_BASED_OUTPATIENT_CLINIC_OR_DEPARTMENT_OTHER)
Admission: EM | Admit: 2021-08-12 | Discharge: 2021-08-12 | Disposition: A | Discharge status: Home / Self Care | Payer: BC Managed Care – PPO | Attending: Emergency Medicine | Admitting: Emergency Medicine

## 2021-08-12 DIAGNOSIS — Z20822 Contact with and (suspected) exposure to covid-19: Secondary | ICD-10-CM | POA: Insufficient documentation

## 2021-08-12 DIAGNOSIS — J101 Influenza due to other identified influenza virus with other respiratory manifestations: Secondary | ICD-10-CM | POA: Diagnosis not present

## 2021-08-12 DIAGNOSIS — R059 Cough, unspecified: Secondary | ICD-10-CM | POA: Diagnosis present

## 2021-08-12 LAB — RESP PANEL BY RT-PCR (FLU A&B, COVID) ARPGX2
Influenza A by PCR: POSITIVE — AB
Influenza B by PCR: NEGATIVE
SARS Coronavirus 2 by RT PCR: NEGATIVE

## 2021-08-12 NOTE — ED Notes (Signed)
AVS provided to and discussed with patient. Pt verbalizes understanding of discharge instructions and denies any questions or concerns at this time. Pt ambulated out of department independently with steady gait.  

## 2021-08-12 NOTE — ED Triage Notes (Signed)
Pt reports cough, body aches, chest pain, fever and chills since yesterday. Some diarrhea Pt has been taking Nyquill

## 2021-08-12 NOTE — Discharge Instructions (Signed)
Return to ED with any new or worsening symptoms such as shortness of breath, wheezing, excessive diarrhea, vomiting Treat your fevers with Tylenol.  Treat your body aches with ibuprofen. Continue to remain hydrated utilizing Pedialyte mixed with water Isolate yourself at home for 72 hours or until you have been afebrile for 24 hours I have written you a work note.  It is attached in this document.

## 2021-08-12 NOTE — ED Provider Notes (Signed)
[facility] EMERGENCY DEPARTMENT Provider Note   CSN: [id number] Arrival date & time: 08/12/21  1008     History Chief Complaint  Patient presents with   Cough    Terry Barry is a 30 y.o. male is a 30 year old male with no significant medical history who presents for upper respiratory infection-like symptoms since yesterday.  Patient states that he flew home from [facility] yesterday and at the airport began experiencing cough, headaches, body aches, fever up to 101.8, shortness of breath and 1 episode of diarrhea.  Patient states he tried to alleviate the symptoms utilizing NyQuil, turmeric with moderate relief.  Patient states symptoms always return.  Patient denies nausea, vomiting, sensitivity to light.  Patient denies being up-to-date on his COVID or flu vaccinations.     Cough Associated symptoms: chills, diaphoresis, fever, headaches, shortness of breath and sore throat   Associated symptoms: no wheezing       Past Medical History:  Diagnosis Date   Rhabdomyolysis     There are no problems to display for this patient.   History reviewed. No pertinent surgical history.     No family history on file.  Social History   Tobacco Use   Smoking status: Never  Substance Use Topics   Alcohol use: No   Drug use: No    Home Medications Prior to Admission medications   Medication Sig Start Date End Date Taking? Authorizing Provider  HYDROcodone-acetaminophen (NORCO/VICODIN) 5-325 MG per tablet Take 1 tablet by mouth every 6 (six) hours as needed for moderate pain. 07/17/13   Bobbye Charleston, MD  ibuprofen (ADVIL) 800 MG tablet Take 1 tablet (800 mg total) by mouth every 8 (eight) hours as needed for fever or moderate pain. 09/04/20   Long, Arlyss Repress, MD  naproxen (NAPROSYN) 500 MG tablet Take 1 tablet (500 mg total) by mouth 2 (two) times daily. 09/11/15   Vanetta Mulders, MD    Allergies    Patient has no known allergies.  Review of  Systems   Review of Systems  Constitutional:  Positive for chills, diaphoresis and fever.  HENT:  Positive for sore throat.   Respiratory:  Positive for cough and shortness of breath. Negative for wheezing.   Gastrointestinal:  Positive for diarrhea. Negative for abdominal pain, nausea and vomiting.  Neurological:  Positive for headaches.  All other systems reviewed and are negative.  Physical Exam Updated Vital Signs BP 123/80 (BP Location: Right Arm)    Pulse 81    Temp 100.1 F (37.8 C)    Resp 20    Ht 5\' 7"  (1.702 m)    Wt 74.8 kg    SpO2 99%    BMI 25.84 kg/m   Physical Exam Vitals and nursing note reviewed.  Constitutional:      General: He is not in acute distress.    Appearance: He is diaphoretic. He is not toxic-appearing.  HENT:     Head: Normocephalic.     Nose: Nose normal.     Mouth/Throat:     Mouth: Mucous membranes are moist.     Pharynx: Posterior oropharyngeal erythema present.  Eyes:     Extraocular Movements: Extraocular movements intact.     Pupils: Pupils are equal, round, and reactive to light.  Cardiovascular:     Rate and Rhythm: Normal rate and regular rhythm.  Pulmonary:     Effort: Pulmonary effort is normal.     Breath sounds: No wheezing.  Abdominal:  General: Abdomen is flat.     Palpations: Abdomen is soft.     Tenderness: There is no abdominal tenderness.  Musculoskeletal:     Cervical back: Normal range of motion. No rigidity.  Lymphadenopathy:     Cervical: No cervical adenopathy.  Skin:    General: Skin is warm.     Capillary Refill: Capillary refill takes less than 2 seconds.  Neurological:     General: No focal deficit present.     Mental Status: He is alert.    ED Results / Procedures / Treatments   Labs (all labs ordered are listed, but only abnormal results are displayed) Labs Reviewed  RESP PANEL BY RT-PCR (FLU A&B, COVID) ARPGX2 - Abnormal; Notable for the following components:      Result Value   Influenza A by  PCR POSITIVE (*)    All other components within normal limits    EKG None  Radiology No results found.  Procedures Procedures   Medications Ordered in ED Medications - No data to display  ED Course  I have reviewed the triage vital signs and the nursing notes.  Pertinent labs & imaging results that were available during my care of the patient were reviewed by me and considered in my medical decision making (see chart for details).    MDM Rules/Calculators/A&P                          30 year old male with no significant medical history presents to ED for complaint of viral-like symptoms.  On examination, patient has temperature of 100.1, nonhypoxic, not tachycardic, clear lung sounds bilaterally.  Patient is diaphoretic.  Results of patient respiratory panel have positive result for influenza A.  I will counsel this patient on symptomatic treatment and offer him Tamiflu.  This patient presents with symptoms suspicious for likely viral upper respiratory infection.  Based on history and physical doubt sinusitis. I considered but think unlikely dangerous causes of this patient's symptoms to include COPD, pneumonia, pneumothorax. COVID and flu test was sent off resulted in a positive influenza A result.  Patient is nontoxic-appearing and not in need of emergent medical intervention at this time.  I instructed this patient to self isolate at home until symptoms subside for 72 hours and he has been afebrile for 24 hours.  I counseled this patient on his diagnosis and offered him Tamiflu.  After shared decision-making discussion, the patient declined the prescription for Tamiflu.  I instructed this patient to return to the ED with any new or worsening symptoms such as shortness of breath, wheezing, excessive diarrhea or vomiting.  I explained to him what excessive diarrhea looks like.  I discussed this Patient's case with Dr. Stevie Kern who feels this patient is appropriate for discharge at this  time.  The patient is in agreement with the plan for discharge.  The patient is stable on discharge.  Final Clinical Impression(s) / ED Diagnoses Final diagnoses:  Influenza A    Rx / DC Orders ED Discharge Orders     None        Al Decant, PA-C 08/12/21 1143    Milagros Loll, MD 08/13/21 [phone]
